# Patient Record
Sex: Female | Born: 2000 | Hispanic: Yes | Marital: Single | Smoking: Never smoker
Health system: Southern US, Community
[De-identification: ages and names within clinical notes are randomized; demographics above are authoritative.]

---
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
## 2022-05-16 ENCOUNTER — Other Ambulatory Visit: Payer: Self-pay

## 2022-05-16 ENCOUNTER — Encounter (HOSPITAL_COMMUNITY): Payer: Self-pay

## 2022-05-16 ENCOUNTER — Emergency Department (HOSPITAL_COMMUNITY)
Admission: EM | Admit: 2022-05-16 | Discharge: 2022-05-16 | Disposition: A | Discharge status: Home / Self Care | Payer: Medicaid Other | Attending: Emergency Medicine | Admitting: Emergency Medicine

## 2022-05-16 ENCOUNTER — Emergency Department (HOSPITAL_COMMUNITY): Payer: Medicaid Other

## 2022-05-16 DIAGNOSIS — M7989 Other specified soft tissue disorders: Secondary | ICD-10-CM | POA: Diagnosis not present

## 2022-05-16 DIAGNOSIS — S93401A Sprain of unspecified ligament of right ankle, initial encounter: Secondary | ICD-10-CM | POA: Insufficient documentation

## 2022-05-16 DIAGNOSIS — S99911A Unspecified injury of right ankle, initial encounter: Secondary | ICD-10-CM | POA: Diagnosis not present

## 2022-05-16 DIAGNOSIS — X501XXA Overexertion from prolonged static or awkward postures, initial encounter: Secondary | ICD-10-CM | POA: Insufficient documentation

## 2022-05-16 NOTE — ED Triage Notes (Signed)
Pt c/o right foot pain since getting off work [employer], believes she pulled a ligament when stepping wrong, has hx of same in left leg.

## 2022-05-16 NOTE — ED Provider Notes (Signed)
[place] EMERGENCY DEPARTMENT AT [facility] Provider Note   CSN: [id number] Arrival date & time: 05/16/22  2211     History  Chief Complaint  Patient presents with   Foot Pain    Natalie Mosley is a 22 y.o. female.  She denies any significant PMH.  She presents  to the ER today complaining of right lateral ankle pain after twisting it while walking. She did not fall the ground, no knee pain.  States it is painful to try to walk.  Denies any numbness or tingling or color change to the foot.   Foot Pain       Home Medications Prior to Admission medications   Medication Sig Start Date End Date Taking? Authorizing Provider  ibuprofen (ADVIL) 800 MG tablet Take 1 tablet (800 mg total) by mouth 3 (three) times daily. Take with food 11/09/20   Pauline Aus, PA-C      Allergies    Patient has no known allergies.    Review of Systems   Review of Systems  Physical Exam Updated Vital Signs BP (!) 147/93 (BP Location: Right Arm)   Pulse 89   Temp 98.5 F (36.9 C) (Oral)   Resp 18   Ht 5\' 10"  (1.778 m)   Wt 104.3 kg   SpO2 99%   BMI 33.00 kg/m  Physical Exam Vitals and nursing note reviewed.  Constitutional:      General: She is not in acute distress.    Appearance: She is well-developed.  HENT:     Head: Normocephalic and atraumatic.     Mouth/Throat:     Mouth: Mucous membranes are moist.  Eyes:     Conjunctiva/sclera: Conjunctivae normal.  Cardiovascular:     Rate and Rhythm: Normal rate and regular rhythm.     Heart sounds: No murmur heard. Pulmonary:     Effort: Pulmonary effort is normal. No respiratory distress.     Breath sounds: Normal breath sounds.  Abdominal:     Palpations: Abdomen is soft.     Tenderness: There is no abdominal tenderness.  Musculoskeletal:        General: Swelling present.     Cervical back: Neck supple.     Comments: Swelling and tenderness over posterior aspect of right lateral malleolus.  DP and PT  pulse intact in right foot.  Sensation intact.  No tenderness over Achilles, no defect felt with her Achilles.  Skin:    General: Skin is warm and dry.     Capillary Refill: Capillary refill takes less than 2 seconds.  Neurological:     General: No focal deficit present.     Mental Status: She is alert and oriented to person, place, and time.  Psychiatric:        Mood and Affect: Mood normal.     ED Results / Procedures / Treatments   Labs (all labs ordered are listed, but only abnormal results are displayed) Labs Reviewed - No data to display  EKG None  Radiology No results found.  Procedures Procedures    Medications Ordered in ED Medications - No data to display  ED Course/ Medical Decision Making/ A&P                             Medical Decision Making This patient presents to the ED for concern of right ankle pain and swelling after twisting the ankle., this involves an extensive number of  treatment options, and is a complaint that carries with it a high risk of complications and morbidity.  The differential diagnosis includes pressure, sprain, dislocation, lesion, other   Imaging: X-ray right ankle ordered, I independently visualized the films, and per my interpretation there is no fracture or dislocation. Lateral soft tissue swelling is noted.  ED course: Patient presents after right ankle injury tonight, she has no other pain, did not fall or hit her head.  X-rays interpreted by me shows no fracture or dislocation.  He likely has a sprain.  Will put her in a cam boot if her crutches to help with weightbearing and have her follow-up with orthopedics.  She was offered ibuprofen in the ED for pain but states as long she is not moving she does not have significant pain and did not want any medication here.  She is advised on ice, elevation, rest the need for follow-up and she was given strict return precautions.   Amount and/or Complexity of Data Reviewed Radiology:  ordered.           Final Clinical Impression(s) / ED Diagnoses Final diagnoses:  Sprain of right ankle, unspecified ligament, initial encounter    Rx / DC Orders ED Discharge Orders     None         Josem Kaufmann 05/16/22 2330    Terrilee Files, MD 05/17/22 [phone]

## 2022-05-16 NOTE — Discharge Instructions (Addendum)
Today for a right ankle injury.  Your x-ray does not show any fracture or dislocation.  You are given a boot and crutches, use ibuprofen as needed for pain, keep your ankle elevated.  Follow-up with orthopedic doctor. Come back to the ER for new or worsening symptoms.

## 2022-05-18 ENCOUNTER — Telehealth: Payer: Self-pay | Admitting: Orthopedic Surgery

## 2022-05-18 NOTE — Telephone Encounter (Signed)
Spoke w/the patient, she went to the ED on 05/16/22 for a rt ankle injury, she stated that it was work related.  She hasn't reported it.  She is going to report it and call back to schedule.

## 2022-05-19 ENCOUNTER — Encounter: Payer: Self-pay | Admitting: Physician Assistant

## 2022-05-19 ENCOUNTER — Ambulatory Visit (INDEPENDENT_AMBULATORY_CARE_PROVIDER_SITE_OTHER): Payer: Medicaid Other | Admitting: Physician Assistant

## 2022-05-19 DIAGNOSIS — S93401A Sprain of unspecified ligament of right ankle, initial encounter: Secondary | ICD-10-CM | POA: Diagnosis not present

## 2022-05-19 NOTE — Progress Notes (Signed)
Office Visit Note   Patient: Natalie Mosley           Date of Birth: 12-11-2000           MRN: [id number] Visit Date: 05/19/2022              Requested by: Rosiland Oz, MD [address] [place],  [place] [address] PCP: Rosiland Oz, MD   Assessment & Plan: Visit Diagnoses:  1. Sprain of unspecified ligament of right ankle, initial encounter     Plan: Patient is a pleasant 22 year old woman who comes in today complaining of right lateral ankle pain.  She is 3 days status post inversion injury to her right ankle.  This is while she was walking out of work.  She felt a pop on the outside of her ankle.  Has a history of ankle sprains on her left but none on the right.  She does not really have any medial pain no proximal pain.  X-rays were reviewed did not show any osseous abnormality.  She is tender mostly over the ATFL.  Findings consistent with an ankle sprain.  She is in a short cam boot will continue to wear this but should come out to ice as well as do alphabet exercises which I demonstrated to her today.  Will follow-up in 2 weeks at which time she can transition to an ASO possibly physical therapy  Follow-Up Instructions: Return in about 2 weeks (around 06/02/2022).   Orders:  No orders of the defined types were placed in this encounter.  No orders of the defined types were placed in this encounter.     Procedures: No procedures performed   Clinical Data: No additional findings.   Subjective: Chief Complaint  Patient presents with   Right Ankle - Pain    HPI pleasant 22 year old woman who is 3 days status post inversion injury to her right ankle after walking out of work.  No previous injury of to this right ankle though does have a history of left ankle sprains says she did hear a pop was seen and evaluated at urgent care which demonstrated no osseous injuries on x-ray  Review of Systems  All other systems reviewed and are  negative.    Objective: Vital Signs: There were no vitals taken for this visit.  Physical Exam Constitutional:      Appearance: Normal appearance.  Pulmonary:     Effort: Pulmonary effort is normal.  Neurological:     General: No focal deficit present.     Mental Status: She is alert.  Psychiatric:        Mood and Affect: Mood normal.     Ortho Exam Right ankle she has strong pulses she is neurovascular intact mild to moderate soft tissue swelling and little ecchymosis laterally.  Not really tender over the distal fibula definitely tender over the lateral ligaments.  Is able to dorsiflex and plantarflex against resistance slightly weak on inversion and eversion but is able to actively do this.  Compartments are soft and compressible no evidence of any cellulitis Specialty Comments:  No specialty comments available.  Imaging: No results found.   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 No pertinent surgical history. Social History   Occupational History   Not on file  Tobacco Use   Smoking status: Never   Smokeless tobacco: Never  Substance and Sexual Activity   Alcohol use: Not on file   Drug use: Not on file   Sexual activity: Not on file

## 2022-06-02 ENCOUNTER — Ambulatory Visit: Payer: Medicaid Other | Admitting: Physician Assistant

## 2022-06-02 ENCOUNTER — Encounter: Payer: Self-pay | Admitting: Physician Assistant

## 2022-06-02 DIAGNOSIS — S93401A Sprain of unspecified ligament of right ankle, initial encounter: Secondary | ICD-10-CM

## 2022-06-02 NOTE — Progress Notes (Signed)
Office Visit Note   Patient: Natalie Mosley           Date of Birth: 08-17-2000           MRN: [id number] Visit Date: 06/02/2022              Requested by: Rosiland Oz, MD [address] [place],  [place] [address] PCP: Rosiland Oz, MD  Chief Complaint  Patient presents with   Right Ankle - Follow-up      HPI: Patient is a pleasant 22 year old woman who I saw a couple weeks ago.  She is now 4 weeks status post inversion injury to her ankle.  She does say her ankle feels tight but she is doing slightly better.  She has been wearing the boot as instructed and coming out to do range of motion.  Relates her pain is mild to moderate when she turns her ankle in a certain way.  Pain still limited to the lateral side of her ankle little bit more pain over the posterior lateral ankle in the area of the peroneal tendons  Assessment & Plan: Visit Diagnoses: Right ankle sprain  Plan: Will transition her to an ASO brace.  Discussed with her doing physical therapy versus a home directed program she like to try some exercises on her own she was provided a Thera-Band and exercises.  Will follow-up in a month.  Would assume that she be a lot better by then if she had continuing problems could consider an MRI at that time  Follow-Up Instructions: 4 weeks  Ortho Exam  Patient is alert, oriented, no adenopathy, well-dressed, normal affect, normal respiratory effort. Right ankle mild soft tissue swelling laterally she has good plantarflexion no pain reproduced over the peroneal tendons good inversion eversion good endpoint on anterior draw.  Still mildly tender over the lateral ligaments no medial tenderness negative squeeze test.  No subluxation of the peroneal tendons is noted she is neurovascular intact  Imaging: No results found. No images are attached to the encounter.  Labs: Lab Results  Component Value Date   HGBA1C 5.7 (H) 09/21/2017   HGBA1C 6.0 03/28/2017    HGBA1C 5.7 (H) 10/27/2016     No results found for: "ALBUMIN", "PREALBUMIN", "CBC"  No results found for: "MG" Lab Results  Component Value Date   VD25OH 19 (L) 02/06/2015   VD25OH 16 (L) 06/23/2014    No results found for: "PREALBUMIN"    Latest Ref Rng & Units 04/07/2020   11:37 AM  CBC EXTENDED  WBC 4.5 - 13.0 Thousand/uL 10.3   RBC 3.80 - 5.10 Million/uL 4.80   Hemoglobin 11.5 - 15.3 g/dL 21.3   HCT 08.6 - 57.8 % 43.5   Platelets 140 - 400 Thousand/uL 302   NEUT# 1,800 - 8,000 cells/uL 5,459   Lymph# 1,200 - 5,200 cells/uL 3,986      There is no height or weight on file to calculate BMI.  Orders:  No orders of the defined types were placed in this encounter.  No orders of the defined types were placed in this encounter.    Procedures: No procedures performed  Clinical Data: No additional findings.  ROS:  All other systems negative, except as noted in the HPI. Review of Systems  Objective: Vital Signs: There were no vitals taken for this visit.  Specialty Comments:  No specialty comments available.  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 Social History   Occupational History   Not on file  Tobacco Use   Smoking status: Never   Smokeless tobacco: Never  Substance and Sexual  Activity   Alcohol use: Not on file   Drug use: Not on file   Sexual activity: Not on file

## 2022-07-01 ENCOUNTER — Ambulatory Visit: Payer: Medicaid Other | Admitting: Physician Assistant

## 2023-08-16 IMAGING — CR DG ANKLE COMPLETE 3+V*L*
3 series · 3 of 3 positions shown · non-contrast
Comparison: None.

CLINICAL DATA: Injury today with left ankle pain, initial
encounter.

EXAM:
LEFT ANKLE COMPLETE - 3+ VIEW

[x ap left]
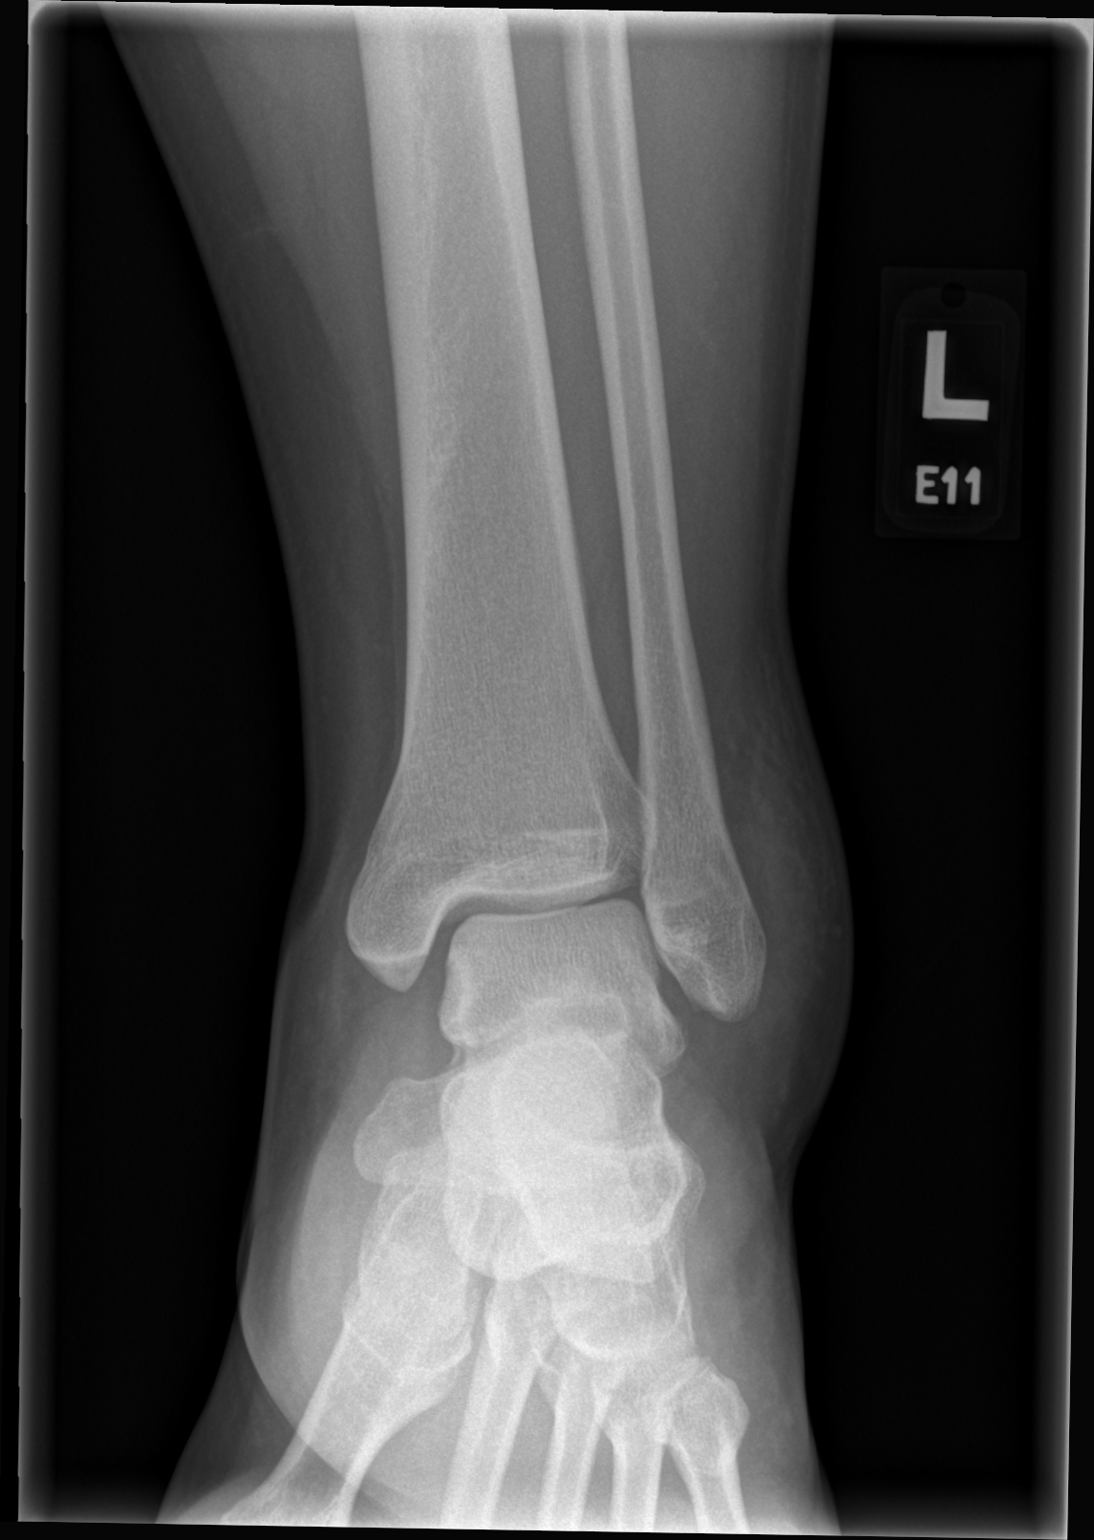

[x medial oblique left]
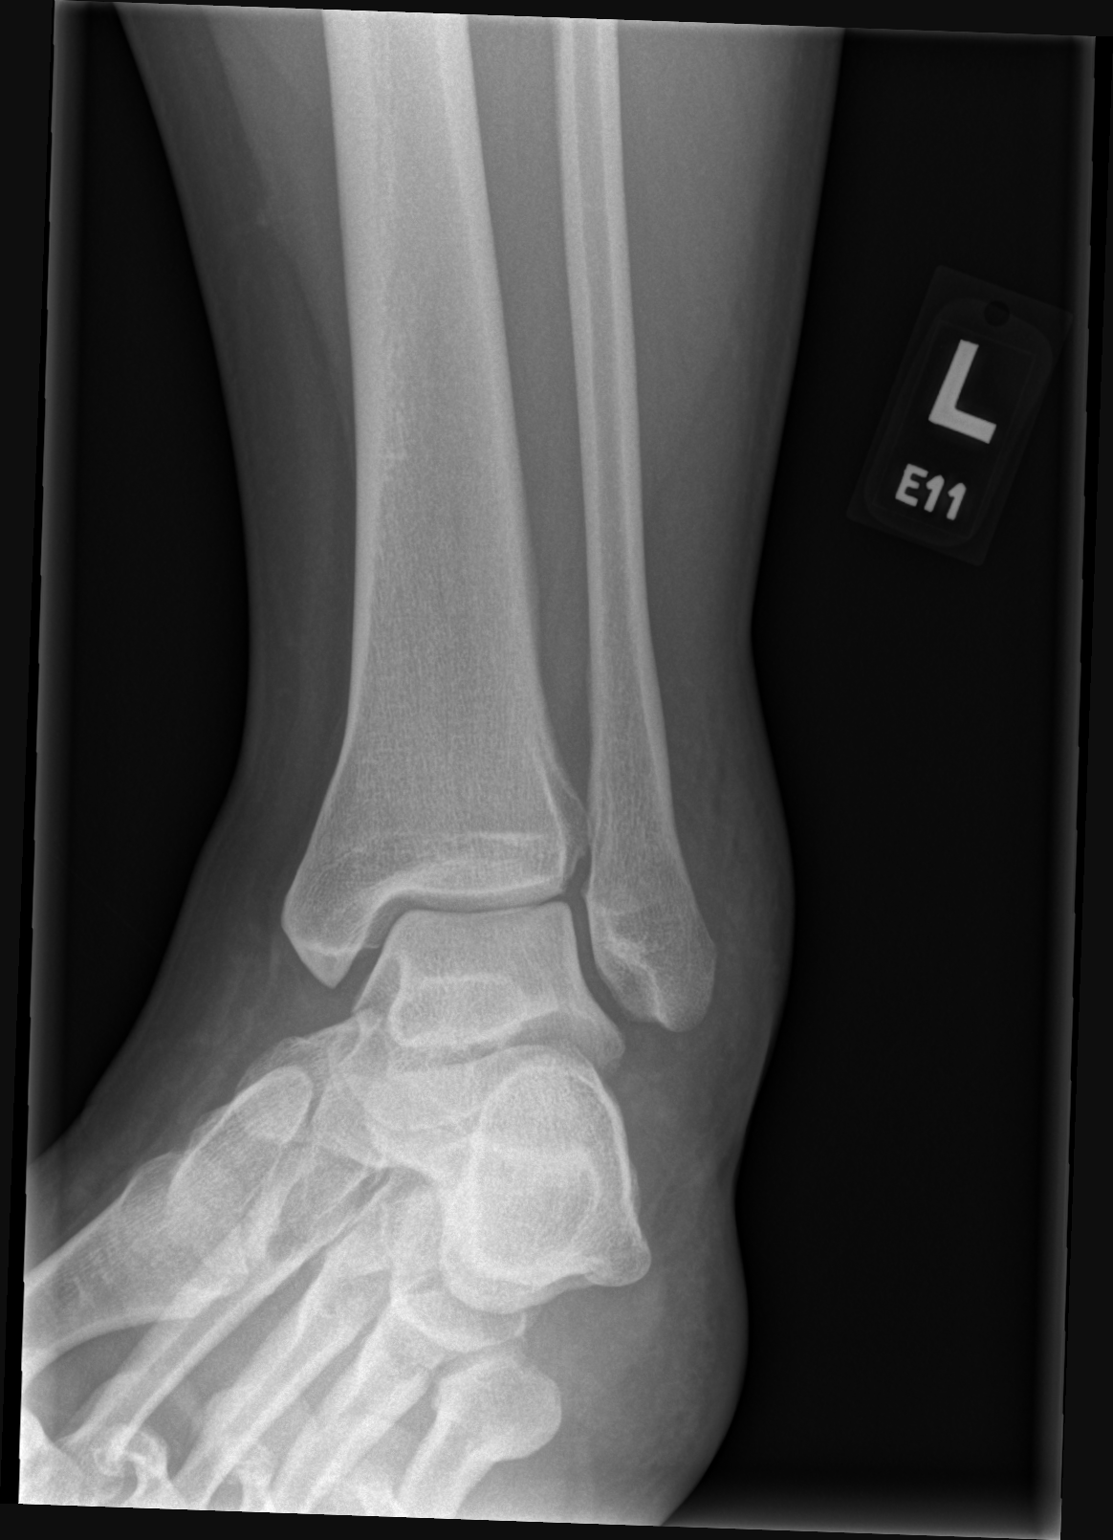

[x lateral left]
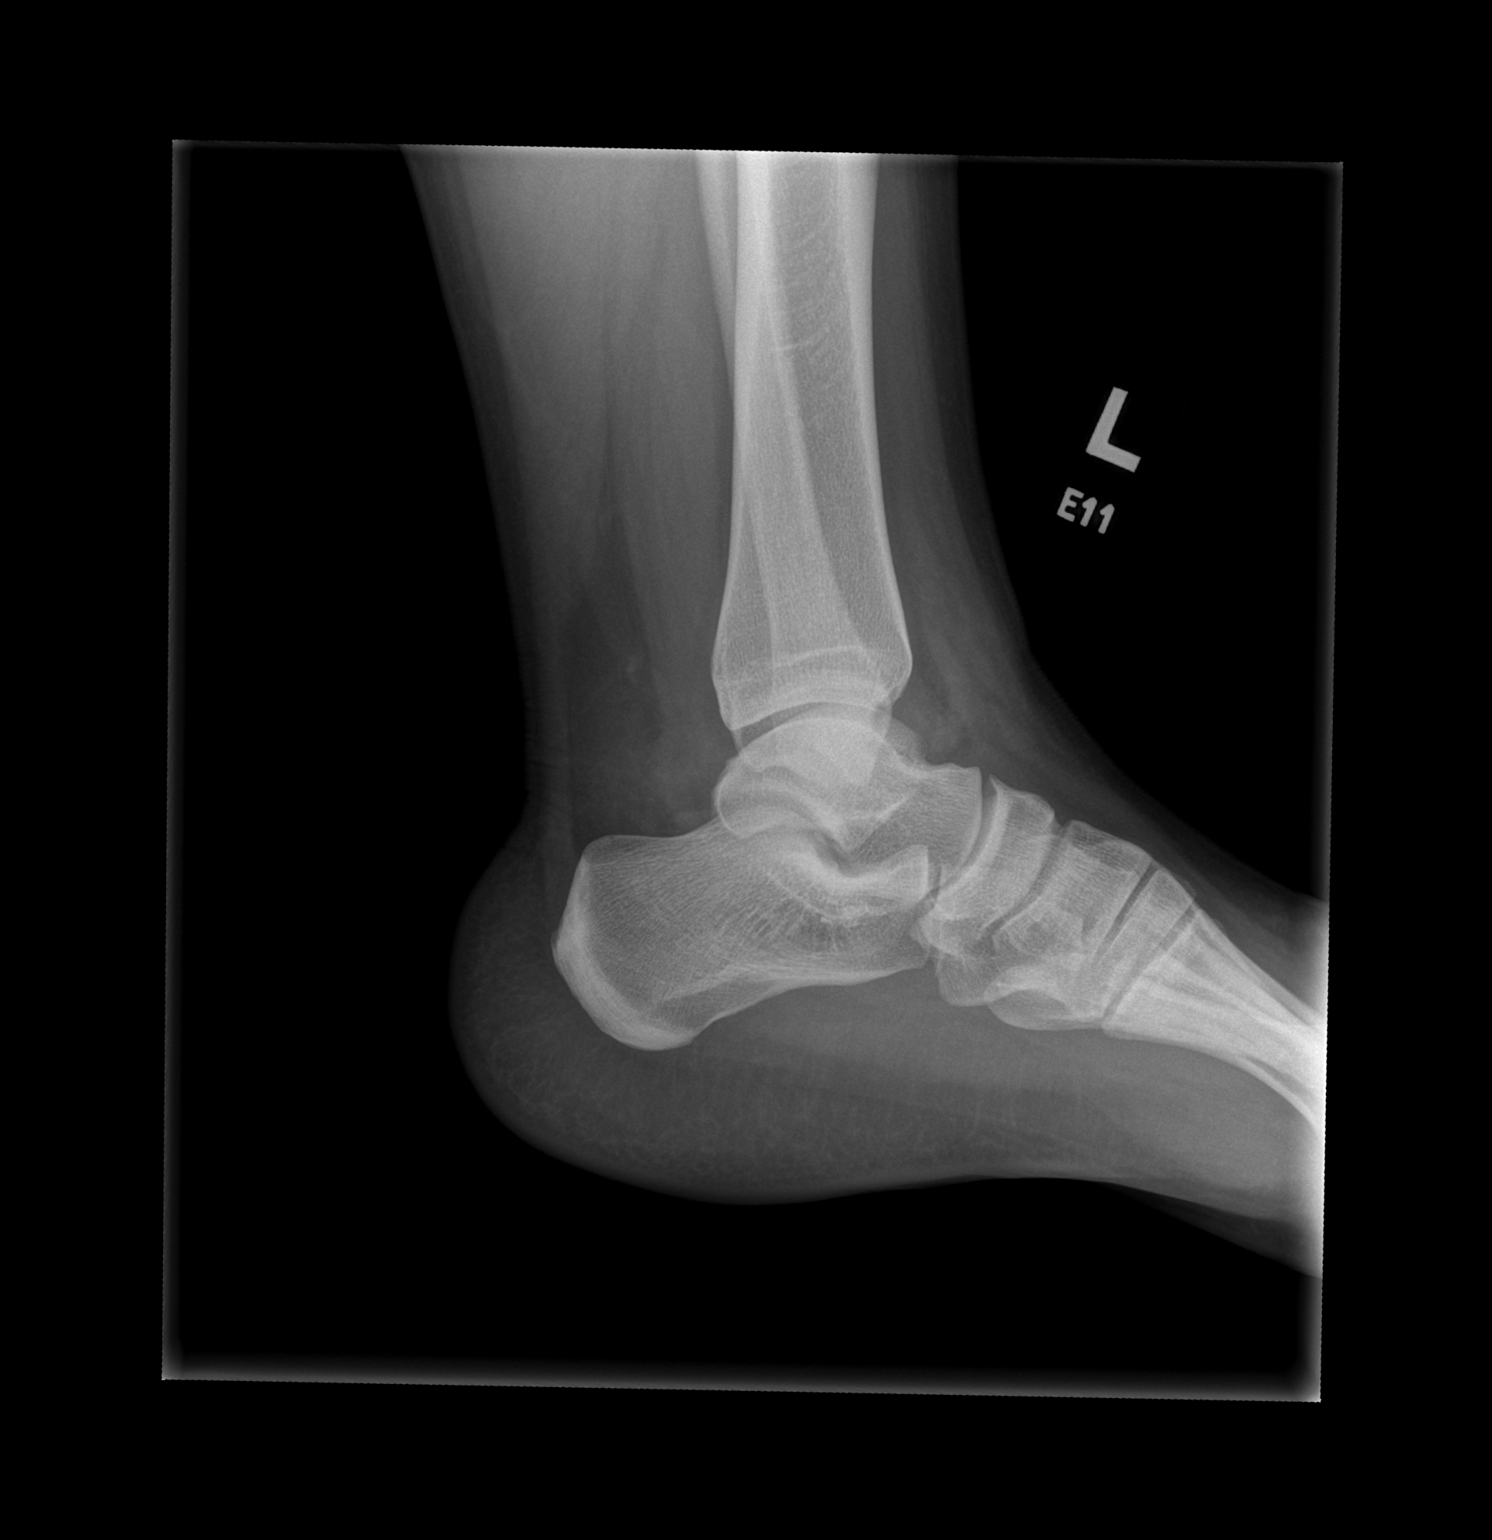

[3 of 3 positions shown; findings below may reference images not displayed]

FINDINGS: Marked soft tissue swelling over the lateral malleolus. No
underlying fracture. Ankle mortise is intact.
IMPRESSION: Marked soft tissue swelling over the lateral malleolus. No
underlying fracture.
